# Patient Record
Sex: Male | Born: 1950 | Race: White | Hispanic: No | Marital: Married | Smoking: Former smoker
Health system: Southern US, Community
[De-identification: ages and names within clinical notes are randomized; demographics above are authoritative.]

## PROBLEM LIST (undated history)

## (undated) DIAGNOSIS — I639 Cerebral infarction, unspecified: Secondary | ICD-10-CM

## (undated) DIAGNOSIS — E119 Type 2 diabetes mellitus without complications: Secondary | ICD-10-CM

## (undated) DIAGNOSIS — I10 Essential (primary) hypertension: Secondary | ICD-10-CM

## (undated) HISTORY — PX: FRACTURE SURGERY: SHX138

---

## 2012-11-26 ENCOUNTER — Ambulatory Visit: Payer: Self-pay | Admitting: Emergency Medicine

## 2014-08-09 ENCOUNTER — Ambulatory Visit: Payer: Self-pay | Admitting: Physician Assistant

## 2016-08-29 ENCOUNTER — Ambulatory Visit (INDEPENDENT_AMBULATORY_CARE_PROVIDER_SITE_OTHER): Payer: Commercial Managed Care - PPO

## 2016-08-29 ENCOUNTER — Ambulatory Visit
Admission: EM | Admit: 2016-08-29 | Discharge: 2016-08-29 | Disposition: A | Discharge status: Home / Self Care | Payer: Commercial Managed Care - PPO | Attending: Family Medicine | Admitting: Family Medicine

## 2016-08-29 ENCOUNTER — Encounter: Payer: Self-pay | Admitting: Emergency Medicine

## 2016-08-29 DIAGNOSIS — J069 Acute upper respiratory infection, unspecified: Secondary | ICD-10-CM

## 2016-08-29 DIAGNOSIS — J020 Streptococcal pharyngitis: Secondary | ICD-10-CM | POA: Diagnosis not present

## 2016-08-29 HISTORY — DX: Type 2 diabetes mellitus without complications: E11.9

## 2016-08-29 HISTORY — DX: Cerebral infarction, unspecified: I63.9

## 2016-08-29 HISTORY — DX: Essential (primary) hypertension: I10

## 2016-08-29 LAB — RAPID INFLUENZA A&B ANTIGENS (ARMC ONLY): INFLUENZA A (ARMC): NEGATIVE

## 2016-08-29 LAB — RAPID INFLUENZA A&B ANTIGENS: Influenza B (ARMC): NEGATIVE

## 2016-08-29 LAB — RAPID STREP SCREEN (MED CTR MEBANE ONLY): STREPTOCOCCUS, GROUP A SCREEN (DIRECT): POSITIVE — AB

## 2016-08-29 MED ORDER — HYDROCOD POLST-CPM POLST ER 10-8 MG/5ML PO SUER: 5.0000 mL | Freq: Every evening | ORAL | 0 refills | Status: AC | PRN

## 2016-08-29 MED ORDER — PENICILLIN G BENZATHINE 1200000 UNIT/2ML IM SUSP
1.2000 10*6.[IU] | Freq: Once | INTRAMUSCULAR | Status: AC
  Administered 2016-08-29: 1.2 10*6.[IU] via INTRAMUSCULAR

## 2016-08-29 MED ORDER — BENZONATATE 100 MG PO CAPS: 100.0000 mg | ORAL_CAPSULE | Freq: Three times a day (TID) | ORAL | 0 refills | Status: AC | PRN

## 2016-08-29 MED ORDER — IBUPROFEN 800 MG PO TABS
800.0000 mg | ORAL_TABLET | Freq: Once | ORAL | Status: AC
  Administered 2016-08-29: 800 mg via ORAL

## 2016-08-29 NOTE — ED Provider Notes (Signed)
[facility] ____________________________________________  Time seen: Approximately 7:51 PM  I have reviewed the triage vital signs and the nursing notes.   HISTORY  Chief Complaint Fever; Generalized Body Aches; and Cough   HPI Ryan Oconnor is a 66 y.o. male presenting for the complaints of quick onset of cough, chills, body aches with mild sore throat that started last night and increased this morning. Reports generalized body aches. Reports fevers at home. Reports last took Tylenol at 2 PM today. Reports has continued to drink fluids well. Denies direct known sick contacts. Denies nasal congestion. States mild sore throat at this time. Patient reports that he did feel like he could hear some congestion in his chest earlier.  Patient reports that he is currently taking oral amoxicillin for prophylaxis after recent dental implant surgery. Patient reports he had a follow-up with his dentist this past weekend he was having mild pain in his right mandible in which the dentist chose to start antibiotics prophylactically, denies definitive dental infection. Patient reports that he has 2 days left of current amoxicillin prescription. But then has a refill for another 7 days. Patient does report that he has medical history including alpha 1 antitrypsin deficiency and has normal oxygenation saturation is mid 90s.  Denies chest pain, shortness of breath, abdominal pain, dysuria, abnormal extremity pain, abnormal extremity swelling or rash. Denies recent sickness.   PCP: [place]  Past Medical History:  Diagnosis Date  . Diabetes mellitus without complication (HCC)   . Hypertension   . Stroke (HCC)   Psoriasis Alpha 1 antitrypsin deficiency  There are no active problems to display for this patient.   Past Surgical History:  Procedure Laterality Date  . FRACTURE SURGERY       No current facility-administered medications for this encounter.   Current Outpatient  Prescriptions:  .  amLODipine (NORVASC) 5 MG tablet, Take 5 mg by mouth daily., Disp: , Rfl:  .  amoxicillin (AMOXIL) 500 MG tablet, Take 500 mg by mouth 3 (three) times daily., Disp: , Rfl:  .  aspirin EC 81 MG tablet, Take 81 mg by mouth daily., Disp: , Rfl:  .  atorvastatin (LIPITOR) 10 MG tablet, Take 10 mg by mouth daily., Disp: , Rfl:  .  glipiZIDE (GLUCOTROL XL) 2.5 MG 24 hr tablet, Take 2.5 mg by mouth daily with breakfast., Disp: , Rfl:  .  metFORMIN (GLUCOPHAGE) 1000 MG tablet, Take 1,000 mg by mouth 2 (two) times daily with a meal., Disp: , Rfl:  .  Multiple Vitamin (MULTIVITAMIN) tablet, Take 1 tablet by mouth daily., Disp: , Rfl:  .  pantoprazole (PROTONIX) 40 MG tablet, Take 40 mg by mouth daily., Disp: , Rfl:  .  ramipril (ALTACE) 10 MG capsule, Take 10 mg by mouth daily., Disp: , Rfl:  .  benzonatate (TESSALON PERLES) 100 MG capsule, Take 1 capsule (100 mg total) by mouth 3 (three) times daily as needed for cough., Disp: 15 capsule, Rfl: 0 .  chlorpheniramine-HYDROcodone (TUSSIONEX PENNKINETIC ER) 10-8 MG/5ML SUER, Take 5 mLs by mouth at bedtime as needed. do not drive or operate machinery while taking as can cause drowsiness., Disp: 75 mL, Rfl: 0  Allergies Patient has no known allergies.  History reviewed. No pertinent family history.  Social History Social History  Substance Use Topics  . Smoking status: Former Games developermoker  . Smokeless tobacco: Never Used  . Alcohol use No    Review of Systems Constitutional: As above. Eyes: No visual changes. ENT: Positive  sore throat. Cardiovascular: Denies chest pain. Respiratory: Denies shortness of breath. Gastrointestinal: No abdominal pain.   Genitourinary: Negative for dysuria. Musculoskeletal: Negative for back pain. Skin: Negative for rash. Neurological: Negative for focal weakness or numbness.  10-point ROS otherwise negative.  ____________________________________________   PHYSICAL EXAM:  VITAL SIGNS: ED Triage  Vitals  Enc Vitals Group     BP 08/29/16 1941 (!) 153/97     Pulse Rate 08/29/16 1941 (!) 105     Resp 08/29/16 1941 16     Temp 08/29/16 1941 (!) 100.5 F (38.1 C)     Temp Source 08/29/16 1941 Oral     SpO2 08/29/16 1941 96 %     Weight 08/29/16 1936 210 lb (95.3 kg)     Height 08/29/16 1936 6\' 4"  (1.93 m)     Head Circumference --      Peak Flow --      Pain Score 08/29/16 1938 4     Pain Loc --      Pain Edu? --      Excl. in GC? --     Constitutional: Alert and oriented. Well appearing and in no acute distress. Eyes: Conjunctivae are normal. PERRL. EOMI. Head: Atraumatic. No sinus tenderness to palpation. No swelling. No erythema.  Ears: no erythema, normal TMs bilaterally.   Nose:Nasal congestion with clear rhinorrhea  Mouth/Throat: Mucous membranes are moist. Mild pharyngeal erythema. No tonsillar swelling or exudate. No gum line swelling or erythema noted, no dental or gum tenderness to palpation. Neck: No stridor.  No cervical spine tenderness to palpation. Hematological/Lymphatic/Immunilogical: No cervical lymphadenopathy. Cardiovascular: Normal rate, regular rhythm. Grossly normal heart sounds.  Good peripheral circulation. Respiratory: Normal respiratory effort.  No retractions.No wheezes. Mild rhonchi left lower base, improved after cough. Intermittent dry cough noted in room. No focal area of consolidation auscultated. Gastrointestinal: Soft and nontender. No CVA tenderness. Musculoskeletal: Ambulatory with steady gait. No cervical, thoracic or lumbar tenderness to palpation. Neurologic:  Normal speech and language. No gait instability. Skin:  Skin appears warm, dry and intact. No rash noted. Psychiatric: Mood and affect are normal. Speech and behavior are normal. ___________________________________________   LABS (all labs ordered are listed, but only abnormal results are displayed)  Labs Reviewed  RAPID STREP SCREEN (NOT AT [facility]) - Abnormal; Notable for the  following:       Result Value   Streptococcus, Group A Screen (Direct) POSITIVE (*)    All other components within normal limits  RAPID INFLUENZA A&B ANTIGENS ([facility] ONLY)   ____________________________________________  RADIOLOGY  Dg Chest 2 View  Result Date: 08/29/2016 CLINICAL DATA:  Cough with fever and congestion for 2 days EXAM: CHEST  2 VIEW COMPARISON:  None. FINDINGS: No focal pulmonary infiltrate, consolidation or effusion. Heart size within normal limits. Mildly tortuous aorta. No pneumothorax. Surgical clips in the upper abdomen. IMPRESSION: No active cardiopulmonary disease. Electronically Signed   By: Jasmine Pang M.D.   On: 08/29/2016 20:32   ____________________________________________   PROCEDURES Procedures    INITIAL IMPRESSION / ASSESSMENT AND PLAN / ED COURSE  Pertinent labs & imaging results that were available during my care of the patient were reviewed by me and considered in my medical decision making (see chart for details).  Very well-appearing patient. No acute distress. Presenting for quick onset of cough, fever, body aches and sore throat last night into this morning. Clinical appearance suggestive of viral-like infection such as influenza. At some sore throat and pharyngeal erythema present, will evaluate  quick strep. Will also evaluate chest x-ray. Patient reports he is due to be leaving on a cruise tomorrow. 800 mg ibuprofen once in urgent care.  Chest x-ray per radiologist no active cardiopulmonary disease. Quick strep positive. Symptom presentation and quick strep positive, will now evaluate influenza. Influenza negative. Discussed with patient treatment options and discuss concern of strep positive while on amoxicillin. Discussed in detail treatment with oral azithromycin, continuing amoxicillin or Bicillin. Patient requests Bicillin once in urgent care. 1.2 million units Bicillin IM once. Patient states that as long as he is feeling better tomorrow,  fever gone, he is planning to continue on his current cruise, discussed in detail with patient transmission process as well as discussed strict follow-up parameters. Patient states that the cruise ship has medical equipment and providers on board and he will follow-up as needed. Encourage rest, fluids, when necessary antipyretics and supportive measures. We'll also give when necessary Tessalon Perles and Tussionex at night. Patient reports is taking these medications and tolerated well in the past.Discussed indication, risks and benefits of medications with patient.  Discussed follow up with Primary care physician this week. Discussed follow up and return parameters including no resolution or any worsening concerns. Patient verbalized understanding and agreed to plan.   ____________________________________________   FINAL CLINICAL IMPRESSION(S) / ED DIAGNOSES  Final diagnoses:  Strep pharyngitis  Upper respiratory tract infection, unspecified type     New Prescriptions   BENZONATATE (TESSALON PERLES) 100 MG CAPSULE    Take 1 capsule (100 mg total) by mouth 3 (three) times daily as needed for cough.   CHLORPHENIRAMINE-HYDROCODONE (TUSSIONEX PENNKINETIC ER) 10-8 MG/5ML SUER    Take 5 mLs by mouth at bedtime as needed. do not drive or operate machinery while taking as can cause drowsiness.    Note: This dictation was prepared with Dragon dictation along with smaller phrase technology. Any transcriptional errors that result from this process are unintentional.         Renford Dills, NP 08/29/16 2124

## 2016-08-29 NOTE — ED Triage Notes (Signed)
Patient c/o cough, congestion, fever and bodyaches that started today.

## 2016-08-29 NOTE — Discharge Instructions (Signed)
Take medication as prescribed. Rest. Drink plenty of fluids.  ° °Follow up with your primary care physician this week as needed. Return to Urgent care for new or worsening concerns.  ° °

## 2016-12-15 ENCOUNTER — Ambulatory Visit
Admission: EM | Admit: 2016-12-15 | Discharge: 2016-12-15 | Disposition: A | Discharge status: Home / Self Care | Payer: Commercial Managed Care - PPO | Attending: Family Medicine | Admitting: Family Medicine

## 2016-12-15 ENCOUNTER — Encounter: Payer: Self-pay | Admitting: *Deleted

## 2016-12-15 DIAGNOSIS — N410 Acute prostatitis: Secondary | ICD-10-CM | POA: Diagnosis not present

## 2016-12-15 DIAGNOSIS — R300 Dysuria: Secondary | ICD-10-CM | POA: Diagnosis not present

## 2016-12-15 LAB — URINALYSIS, COMPLETE (UACMP) WITH MICROSCOPIC
Bilirubin Urine: NEGATIVE
Glucose, UA: NEGATIVE mg/dL
Hgb urine dipstick: NEGATIVE
Ketones, ur: NEGATIVE mg/dL
Leukocytes, UA: NEGATIVE
NITRITE: NEGATIVE
PROTEIN: NEGATIVE mg/dL
SPECIFIC GRAVITY, URINE: 1.01 (ref 1.005–1.030)
pH: 6.5 (ref 5.0–8.0)

## 2016-12-15 MED ORDER — SULFAMETHOXAZOLE-TRIMETHOPRIM 800-160 MG PO TABS: 1.0000 | ORAL_TABLET | Freq: Two times a day (BID) | ORAL | 0 refills | Status: AC

## 2016-12-15 NOTE — ED Triage Notes (Signed)
Dysuria, intermittent urinary incontinence, possible fever, body aches, chills, fatigue, since yesterday.

## 2016-12-15 NOTE — Discharge Instructions (Signed)
Take medication as prescribed. Rest. Drink plenty of fluids.  ° °Follow up with your primary care physician as discussed. Return to Urgent care for new or worsening concerns.  ° °

## 2016-12-15 NOTE — ED Provider Notes (Signed)
[facility] ____________________________________________  Time seen: Approximately 6:14 PM  I have reviewed the triage vital signs and the nursing notes.   HISTORY  Chief Complaint Dysuria; Urinary Incontinence; Generalized Body Aches; and Chills   HPI Ryan Oconnor is a 66 y.o. male  presenting with wife at bedside for complaints of urinary urgency, urinary frequency and a slight discomfort with urination since yesterday. Patient states he has some generalized coming low abdominal discomfort and low back pain that has not changed with positions. states pain is mild and nonspecific. Patient also reports that yesterday evening he was at a baseball game and had an episode of urinary incontinence. Denies any bowel incontinence. States frequently has diarrhea in which she has had some recently. Denies any melena, frank blood, hematochezia or abnormal appearance. Denies penile or testicular pain, swelling, mass or discharge. Denies concerns of STDs. Denies urinary stream changes. Denies hematuria. Patient reports he has had one kidney infection previously but states at that time he was having more urinary symptoms and hematuria. States that current symptoms do not feel like urinary infection. Denies recent sickness, known triggers. Reports he has felt like he has had some chills and possible fever at home, but did not. No over-the-counter medication or antipyretics taken today. Denies aggravating or alleviating factors. Reports otherwise feels well. Reports continues to eat and drink well.   Denies chest pain, shortness of breath, abdominal pain, cough, congestion, or rash. Denies recent sickness. Denies recent antibiotic use.   [place] [facility]   Past Medical History:  Diagnosis Date  . Diabetes mellitus without complication (HCC)   . Hypertension   . Stroke [facility](HCC)   enlarged prostate  There are no active problems to display for this patient.   Past Surgical  History:  Procedure Laterality Date  . FRACTURE SURGERY       No current facility-administered medications for this encounter.   Current Outpatient Prescriptions:  .  amLODipine (NORVASC) 5 MG tablet, Take 5 mg by mouth daily., Disp: , Rfl:  .  aspirin EC 81 MG tablet, Take 81 mg by mouth daily., Disp: , Rfl:  .  atorvastatin (LIPITOR) 10 MG tablet, Take 10 mg by mouth daily., Disp: , Rfl:  .  glipiZIDE (GLUCOTROL XL) 2.5 MG 24 hr tablet, Take 2.5 mg by mouth daily with breakfast., Disp: , Rfl:  .  metFORMIN (GLUCOPHAGE) 1000 MG tablet, Take 1,000 mg by mouth 2 (two) times daily with a meal., Disp: , Rfl:  .  Multiple Vitamin (MULTIVITAMIN) tablet, Take 1 tablet by mouth daily., Disp: , Rfl:  .  pantoprazole (PROTONIX) 40 MG tablet, Take 40 mg by mouth daily., Disp: , Rfl:  .  ramipril (ALTACE) 10 MG capsule, Take 10 mg by mouth daily., Disp: , Rfl:  .  amoxicillin (AMOXIL) 500 MG tablet, Take 500 mg by mouth 3 (three) times daily., Disp: , Rfl:  .  benzonatate (TESSALON PERLES) 100 MG capsule, Take 1 capsule (100 mg total) by mouth 3 (three) times daily as needed for cough., Disp: 15 capsule, Rfl: 0 .  chlorpheniramine-HYDROcodone (TUSSIONEX PENNKINETIC ER) 10-8 MG/5ML SUER, Take 5 mLs by mouth at bedtime as needed. do not drive or operate machinery while taking as can cause drowsiness., Disp: 75 mL, Rfl: 0 .  sulfamethoxazole-trimethoprim (BACTRIM DS,SEPTRA DS) 800-160 MG tablet, Take 1 tablet by mouth 2 (two) times daily., Disp: 28 tablet, Rfl: 0  Allergies Patient has no known allergies.  family history States no family history  of prostate issues or prostate cancer.  Social History Social History  Substance Use Topics  . Smoking status: Former Games developer  . Smokeless tobacco: Never Used  . Alcohol use No    Review of Systems Constitutional: No fever/chills Cardiovascular: Denies chest pain. Respiratory: Denies shortness of breath. Gastrointestinal: No abdominal pain.  No  nausea, no vomiting.  Genitourinary: Positive for dysuria. Musculoskeletal:As above.  Skin: Negative for rash. Neurological: Negative for headaches, focal weakness or numbness.    ____________________________________________   PHYSICAL EXAM:  VITAL SIGNS: ED Triage Vitals  Enc Vitals Group     BP 12/15/16 1733 (!) 159/92     Pulse Rate 12/15/16 1733 90     Resp 12/15/16 1733 16     Temp 12/15/16 1733 98.8 F (37.1 C)     Temp Source 12/15/16 1733 Oral     SpO2 12/15/16 1733 96 %     Weight 12/15/16 1735 216 lb (98 kg)     Height 12/15/16 1735 6\' 4"  (1.93 m)     Head Circumference --      Peak Flow --      Pain Score --      Pain Loc --      Pain Edu? --      Excl. in GC? --     Constitutional: Alert and oriented. Well appearing and in no acute distress. ENT      Head: Normocephalic and atraumatic.      Nose: No congestion/rhinnorhea.      Mouth/Throat: Mucous membranes are moist. Cardiovascular: Normal rate, regular rhythm. Grossly normal heart sounds.  Good peripheral circulation. Respiratory: Normal respiratory effort without tachypnea nor retractions. Breath sounds are clear and equal bilaterally. No wheezes, rales, rhonchi. Gastrointestinal: Soft and nontender. No distention. Normal Bowel sounds. No CVA tenderness. Prostate: Declined chaperone. Wife at bedside. Normal external appearance, no mass or swelling. Prostate exam firm, enlarged and warm prostate. Prostate mild to moderate tenderness to palpation.  Musculoskeletal:  Ambulatory with steady gait. No midline cervical, thoracic or lumbar tenderness to palpation. Neurologic:  Normal speech and language. Speech is normal. No gait instability.  Skin:  Skin is warm, dry. Psychiatric: Mood and affect are normal. Speech and behavior are normal. Patient exhibits appropriate insight and judgment   ___________________________________________   LABS (all labs ordered are listed, but only abnormal results are  displayed)  Labs Reviewed  URINALYSIS, COMPLETE (UACMP) WITH MICROSCOPIC - Abnormal; Notable for the following:       Result Value   Squamous Epithelial / LPF 0-5 (*)    Bacteria, UA RARE (*)    All other components within normal limits  URINE CULTURE    RADIOLOGY  No results found. ____________________________________________   PROCEDURES Procedures   INITIAL IMPRESSION / ASSESSMENT AND PLAN / ED COURSE  Pertinent labs & imaging results that were available during my care of the patient were reviewed by me and considered in my medical decision making (see chart for details).  Well-appearing patient. No acute distress. Wife at bedside. Presents for complaints of urinary changes since yesterday. Also subjective fevers, and no known fever. Reports continues to eat and drink well. Urinalysis reviewed, rare bacteria, WBC clump. Will culture urine. Suspect prostatitis. Declines concerns of STDs. Will empirically treat patient with oral Bactrim 2 weeks. Follow PCP. Discussed strict follow-up and sooner return parameters.Discussed indication, risks and benefits of medications with patient.  Discussed follow up with Primary care physician this week. Discussed follow up and return parameters including  no resolution or any worsening concerns. Patient verbalized understanding and agreed to plan.   ____________________________________________   FINAL CLINICAL IMPRESSION(S) / ED DIAGNOSES  Final diagnoses:  Acute prostatitis  Dysuria     Discharge Medication List as of 12/15/2016  6:10 PM    START taking these medications   Details  sulfamethoxazole-trimethoprim (BACTRIM DS,SEPTRA DS) 800-160 MG tablet Take 1 tablet by mouth 2 (two) times daily., Starting Thu 12/15/2016, Until Thu 12/29/2016, Normal        Note: This dictation was prepared with Dragon dictation along with smaller phrase technology. Any transcriptional errors that result from this process are unintentional.           Renford Dills, NP 12/15/16 [phone]

## 2016-12-18 LAB — URINE CULTURE

## 2016-12-29 ENCOUNTER — Telehealth: Payer: Self-pay | Admitting: Emergency Medicine

## 2016-12-29 ENCOUNTER — Telehealth: Payer: Self-pay | Admitting: *Deleted

## 2016-12-29 NOTE — Telephone Encounter (Signed)
Patient called for lab results after receiving the contact letter sent to his address on record. Verified DOB, communicated positive for bacteria urine culture and the option of an additional antibiotic if needed. Patient reported feeling much better and declined the additional antibiotic. Advised patient to follow up with PCP if symptoms return.

## 2017-06-16 NOTE — Telephone Encounter (Signed)
close

## 2018-02-24 IMAGING — CR DG CHEST 2V
2 series · 2 of 2 positions shown · non-contrast
Comparison: None.

CLINICAL DATA: Cough with fever and congestion for 2 days

EXAM:
CHEST  2 VIEW

[chest pa]
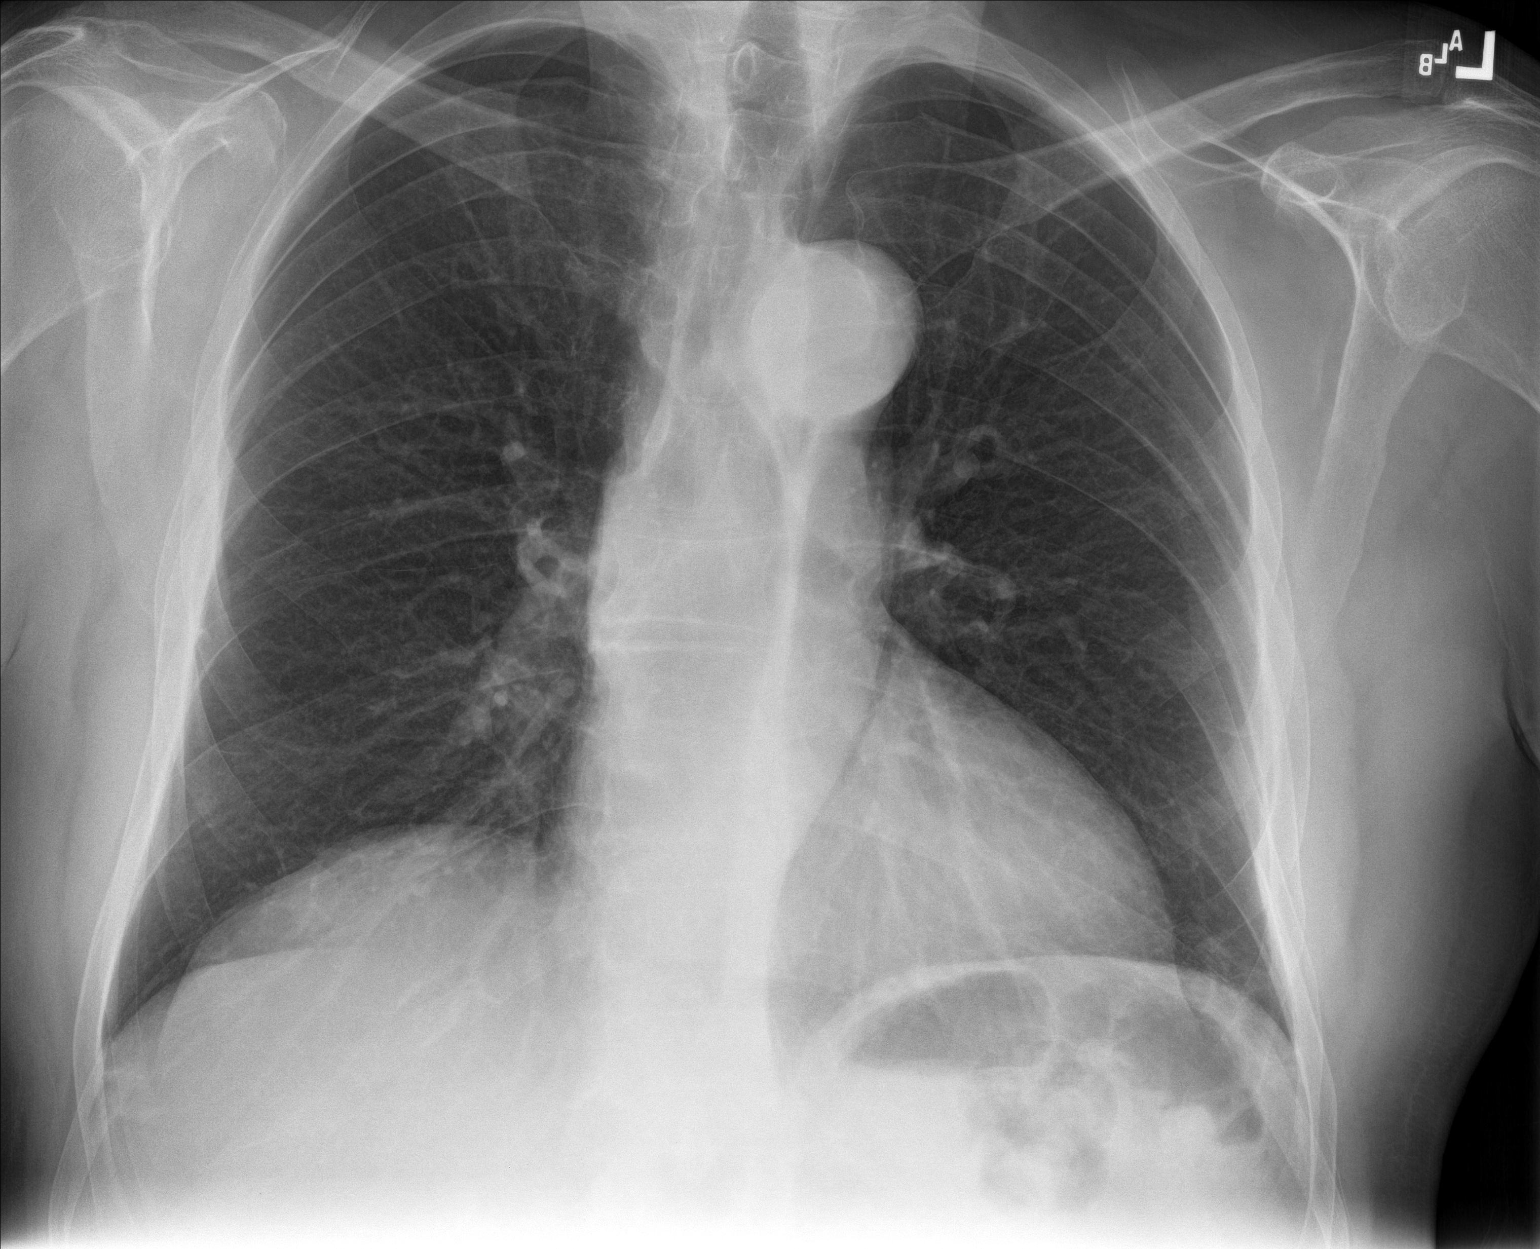

[chest lat]
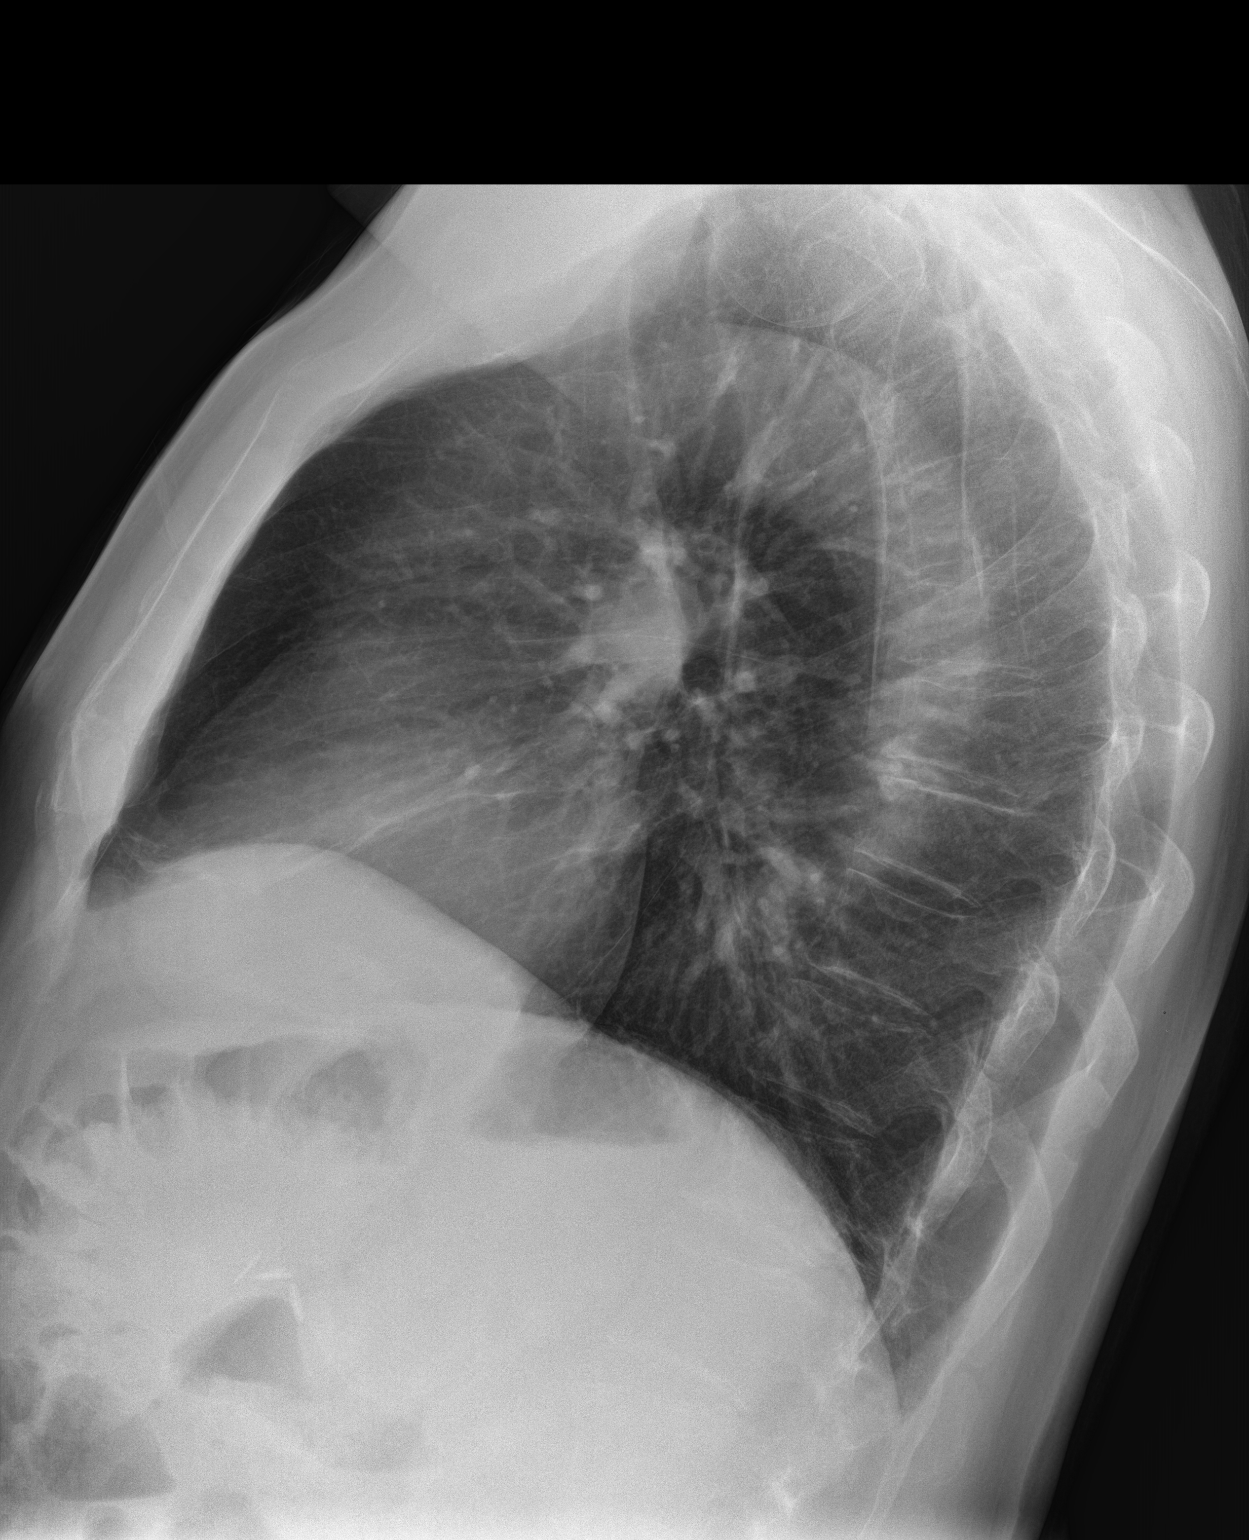

[2 of 2 positions shown; findings below may reference images not displayed]

FINDINGS: No focal pulmonary infiltrate, consolidation or effusion. Heart size
within normal limits. Mildly tortuous aorta. No pneumothorax.
Surgical clips in the upper abdomen.
IMPRESSION: No active cardiopulmonary disease.
